# Patient Record
Sex: Female | Born: 1985 | Race: Black or African American | Hispanic: No | Marital: Single | Smoking: Current every day smoker
Health system: Southern US, Community
[De-identification: ages and names within clinical notes are randomized; demographics above are authoritative.]

## PROBLEM LIST (undated history)

## (undated) DIAGNOSIS — D649 Anemia, unspecified: Secondary | ICD-10-CM

## (undated) HISTORY — DX: Anemia, unspecified: D64.9

---

## 2014-08-17 ENCOUNTER — Encounter (HOSPITAL_COMMUNITY): Payer: Self-pay | Admitting: Emergency Medicine

## 2014-08-17 ENCOUNTER — Emergency Department (HOSPITAL_COMMUNITY)
Admission: EM | Admit: 2014-08-17 | Discharge: 2014-08-17 | Disposition: A | Discharge status: Home / Self Care | Payer: Self-pay | Attending: Emergency Medicine | Admitting: Emergency Medicine

## 2014-08-17 DIAGNOSIS — Z720 Tobacco use: Secondary | ICD-10-CM | POA: Insufficient documentation

## 2014-08-17 DIAGNOSIS — B354 Tinea corporis: Secondary | ICD-10-CM | POA: Insufficient documentation

## 2014-08-17 DIAGNOSIS — R21 Rash and other nonspecific skin eruption: Secondary | ICD-10-CM

## 2014-08-17 MED ORDER — HYDROXYZINE HCL 25 MG PO TABS
25.0000 mg | ORAL_TABLET | Freq: Four times a day (QID) | ORAL | Status: DC | PRN
Start: 2014-08-17 — End: 2016-03-18

## 2014-08-17 MED ORDER — CLOTRIMAZOLE-BETAMETHASONE 1-0.05 % EX CREA: TOPICAL_CREAM | CUTANEOUS | Status: DC

## 2014-08-17 NOTE — Discharge Instructions (Signed)
Use lotrisone cream for itching and rash, and use vistaril for itching, and continue your usual home medications. Avoid contact with other individuals since this is contagious. Wash all bedding and clothing in hot water, and clean all home surfaces with clorox wipes (such as the bathroom). Please followup with your primary doctor for discussion of your diagnoses and further evaluation after today's visit; if you do not have a primary care doctor use the resource guide provided to find one; Also follow-up as needed with dermatologist. Return to the ER with changes or worsening symptoms.   Body Ringworm Ringworm (tinea corporis) is a fungal infection of the skin on the body. This infection is not caused by worms, but is actually caused by a fungus. Fungus normally lives on the top of your skin and can be useful. However, in the case of ringworms, the fungus grows out of control and causes a skin infection. It can involve any area of skin on the body and can spread easily from one person to another (contagious). Ringworm is a common problem for children, but it can affect adults as well. Ringworm is also often found in athletes, especially wrestlers who share equipment and mats.  CAUSES  Ringworm of the body is caused by a fungus called dermatophyte. It can spread by:  Touchingother people who are infected.  Touchinginfected pets.  Touching or sharingobjects that have been in contact with the infected person or pet (hats, combs, towels, clothing, sports equipment). SYMPTOMS   Itchy, raised red spots and bumps on the skin.  Ring-shaped rash.  Redness near the border of the rash with a clear center.  Dry and scaly skin on or around the rash. Not every person develops a ring-shaped rash. Some develop only the red, scaly patches. DIAGNOSIS  Most often, ringworm can be diagnosed by performing a skin exam. Your caregiver may choose to take a skin scraping from the affected area. The sample will be  examined under the microscope to see if the fungus is present.  TREATMENT  Body ringworm may be treated with a topical antifungal cream or ointment. Sometimes, an antifungal shampoo that can be used on your body is prescribed. You may be prescribed antifungal medicines to take by mouth if your ringworm is severe, keeps coming back, or lasts a long time.  HOME CARE INSTRUCTIONS   Only take over-the-counter or prescription medicines as directed by your caregiver.  Wash the infected area and dry it completely before applying yourcream or ointment.  When using antifungal shampoo to treat the ringworm, leave the shampoo on the body for 3-5 minutes before rinsing.   Wear loose clothing to stop clothes from rubbing and irritating the rash.  Wash or change your bed sheets every night while you have the rash.  Have your pet treated by your veterinarian if it has the same infection. To prevent ringworm:   Practice good hygiene.  Wear sandals or shoes in public places and showers.  Do not share personal items with others.  Avoid touching red patches of skin on other people.  Avoid touching pets that have bald spots or wash your hands after doing so. SEEK MEDICAL CARE IF:   Your rash continues to spread after 7 days of treatment.  Your rash is not gone in 4 weeks.  The area around your rash becomes red, warm, tender, and swollen. Document Released: 05/16/2000 Document Revised: 02/11/2012 Document Reviewed: 12/01/2011 [facility] Patient Information 2015 [place]. This information is not intended to replace  advice given to you by your health care provider. Make sure you discuss any questions you have with your health care provider.  Contact Dermatitis Contact dermatitis is a rash that happens when something touches the skin. You touched something that irritates your skin, or you have allergies to something you touched. HOME CARE   Avoid the thing that caused your rash.  Keep your  rash away from hot water, soap, sunlight, chemicals, and other things that might bother it.  Do not scratch your rash.  You can take cool baths to help stop itching.  Only take medicine as told by your doctor.  Keep all doctor visits as told. GET HELP RIGHT AWAY IF:   Your rash is not better after 3 days.  Your rash gets worse.  Your rash is puffy (swollen), tender, red, sore, or warm.  You have problems with your medicine. MAKE SURE YOU:   Understand these instructions.  Will watch your condition.  Will get help right away if you are not doing well or get worse. Document Released: 03/16/2009 Document Revised: 08/11/2011 Document Reviewed: 10/22/2010 [facility] Patient Information 2015 [place], [place]. This information is not intended to replace advice given to you by your health care provider. Make sure you discuss any questions you have with your health care provider.   Emergency Department Resource Guide 1) Find a Doctor and Pay Out of Pocket Although you won't have to find out who is covered by your insurance plan, it is a good idea to ask around and get recommendations. You will then need to call the office and see if the doctor you have chosen will accept you as a new patient and what types of options they offer for patients who are self-pay. Some doctors offer discounts or will set up payment plans for their patients who do not have insurance, but you will need to ask so you aren't surprised when you get to your appointment.  2) Contact Your Local Health Department Not all health departments have doctors that can see patients for sick visits, but many do, so it is worth a call to see if yours does. If you don't know where your local health department is, you can check in your phone book. The CDC also has a tool to help you locate your state's health department, and many state websites also have listings of all of their local health departments.  3) Find a Walk-in Clinic If  your illness is not likely to be very severe or complicated, you may want to try a walk in clinic. These are popping up all over the country in pharmacies, drugstores, and shopping centers. They're usually staffed by nurse practitioners or physician assistants that have been trained to treat common illnesses and complaints. They're usually fairly quick and inexpensive. However, if you have serious medical issues or chronic medical problems, these are probably not your best option.  No Primary Care Doctor: - Call Health Connect at  [phone] - they can help you locate a primary care doctor that  accepts your insurance, provides certain services, etc. - Physician Referral Service- [phone]  Chronic Pain Problems: Organization         Address  Phone   Notes  [facility]  [phone] Patients need to be referred by their primary care doctor.   Medication Assistance: Organization         Address  Phone   Notes  [facility] Medication Assistance Program [address]  [address] [place], [place] [address] [phone] --Must be a resident of [facility] -- Must have NO insurance coverage whatsoever (no Medicaid/ Medicare, etc.) -- The pt. MUST have a primary care doctor that directs their care regularly and follows them in the community   [facility]  [phone]   [employer]  [phone]    Agencies that provide inexpensive medical care: Organization         Address  Phone   Notes  [facility]  [phone]   [facility]    [phone]   [facility] [address] [place], [place] [address] [phone]   [facility] [address] [place] [place] [phone]   [facility]    [phone]   [facility]    [phone]   [facility] and [facility]  [address], [place] Phone:  [phone], Fax:  [phone] Hours of  Operation:  9 am - 6 pm, M-F.  Also accepts Medicaid/Medicare and self-pay.  [facility]  [address], [place] Phone: [phone], Fax: [phone]. Hours of Operation:  8:30 am - 5:30 pm, M-F.  Also accepts Medicaid and self-pay.  [facility] [place] [address] [place] Phone: [phone]   [facility] [address] Natasha Bence [place], [place] [phone], Ext. [phone] Mondays & Thursdays: 7-9 AM.  First 15 patients are seen on a first come, first serve basis.    Medicaid-accepting [facility] Providers:  Organization         Address  Phone   Notes  [facility] [address], [place] [phone] Also accepts self-pay patients.  [facility] [address] Laurell Josephs [place], [place]  [phone]   [facility] [address], [place] [phone]   [facility] [address], [place] [phone]   Renaye Rakers [address], [place]   [phone] Only accepts [place] patients after they have their name applied to their card.   Self-Pay (no insurance) in [facility]:  Organization         Address  Phone   Notes  Sickle Cell Patients, [facility] [address] [place], [place] [phone]   [facility] [address] [employer], [place] [phone]   [facility] [place]  [address], [address], [place] [phone]   [facility]/Dr. Osei-Bonsu  [address] [place] or [address], [place] [phone] Phone number for both [place] and [place] locations is the same.  [facility] and [facility] [address] [place] [address] [place] [phone]   [facility] [address], [place] or [address] [phone]   [facility] [address], [place] [phone], phone; [phone], fax Sees patients 1st and 3rd Saturday of every month.  Must not qualify for public or private insurance (i.e. Medicaid, Medicare, [place] Health Choice, Veterans' Benefits)  Household income should be no more than 200% of the poverty level The clinic cannot treat you if you are pregnant or think you are pregnant  Sexually transmitted diseases are not treated at the clinic.

## 2014-08-17 NOTE — ED Provider Notes (Signed)
CSN: [id number]     Arrival date & time 08/17/14  1201 History  This chart was scribed for non-physician practitioner, Allen DerryMercedes Camprubi-Soms, PA-C, working with Tilden FossaElizabeth Rees, MD, by Ronney LionSuzanne Le, ED Scribe. This patient was seen in room WTR6/WTR6 and the patient's care was started at 1:55 PM.    Chief Complaint  Patient presents with  . Rash   Patient is a 29 y.o. female presenting with rash. The history is provided by the patient. No language interpreter was used.  Rash Location:  Shoulder/arm and torso Shoulder/arm rash location:  R forearm Torso rash location: center chest. Quality: itchiness   Quality: not painful and not weeping   Severity:  Mild Onset quality:  Sudden Duration:  1 week Timing:  Constant Progression:  Spreading Chronicity:  New Context: medications (Diflucan and Flagyl, but she has taken in the past with no issue)   Context: not animal contact, not chemical exposure, not food, not insect bite/sting and not new detergent/soap   Relieved by:  Nothing Worsened by:  Nothing tried Ineffective treatments:  None tried Associated symptoms: no fever, no shortness of breath, no throat swelling and no tongue swelling      HPI Comments: Lauren HumphreyKatrina Vandemark is a 29 y.o. female who presents to the Emergency Department complaining of two pruritic bumps on her right arm 1 week ago and a pruritic rash on her chest that she noticed 2 days ago. She states that she had been to the gym the day before she noticed the bumps on her arm. She denies trying any ointments to the area. She had been taking Flagyl and Diflucan for BV, which she has taken before without any issue. Patient had been to an outdoor birthday party 5 days ago, but didn't notice any insect or tick bites. She denies exposure to new detergents or cosmetics, foods, or pets. Her significant other had a similar rash 3wks ago. She denies pain to the area, drainage, weeping, tongue or lip swelling, trouble swallowing, SOB, CP,  fever, red streaking, or warmth. She denies having a PCP.  History reviewed. No pertinent past medical history. History reviewed. No pertinent past surgical history. History reviewed. No pertinent family history. History  Substance Use Topics  . Smoking status: Current Every Day Smoker    Types: Cigarettes  . Smokeless tobacco: Not on file  . Alcohol Use: Yes   OB History    No data available     Review of Systems  Constitutional: Negative for fever.  HENT: Negative for facial swelling and trouble swallowing.   Respiratory: Negative for shortness of breath.   Skin: Positive for rash.       Negative for drainage, red streaking, warmth  A complete 10 system review of systems was obtained and all systems are negative except as noted in the HPI and PMH.    Allergies  Review of patient's allergies indicates no known allergies.  Home Medications   Prior to Admission medications   Not on File   BP 122/72 mmHg  Pulse 68  Temp(Src) 98.2 F (36.8 C) (Oral)  Resp 18  SpO2 99%  LMP 08/01/2014 Physical Exam  Constitutional: She is oriented to person, place, and time. Vital signs are normal. She appears well-developed and well-nourished.  Non-toxic appearance. No distress.  Afebrile, nontoxic, NAD  HENT:  Head: Normocephalic and atraumatic.  Mouth/Throat: Mucous membranes are normal.  No tongue/lip swelling  Eyes: Conjunctivae and EOM are normal. Right eye exhibits no discharge. Left eye exhibits no  discharge.  Neck: Normal range of motion. Neck supple.  Cardiovascular: Normal rate.   Pulmonary/Chest: Effort normal. No respiratory distress.  Abdominal: Normal appearance. She exhibits no distension.  Musculoskeletal: Normal range of motion.  Neurological: She is alert and oriented to person, place, and time. She has normal strength. No sensory deficit.  Skin: Skin is warm, dry and intact. Rash noted.  2 separate Well circumscribed lesions to R medial elbow, mildly erythematous  with slightly raised borders, no scaling or cellulitis. 4 more well circumscribed similar appearing lesions to R breast/chest wall. No interdigital web space burrowing  Psychiatric: She has a normal mood and affect. Her behavior is normal.  Nursing note and vitals reviewed.   ED Course  Procedures (including critical care time)  DIAGNOSTIC STUDIES: Oxygen Saturation is 99% on room air, normal by my interpretation.    COORDINATION OF CARE: 1:55 PM - Discussed treatment plan with pt at bedside which includes cream to treat ringworm and itching, Vistaril and pt agreed to plan. Suspect allergic reaction or ringworm. Strict return precautions given if patient notices worsening symptoms.   Labs Review Labs Reviewed - No data to display  Imaging Review No results found.   EKG Interpretation None      MDM   Final diagnoses:  Tinea corporis  Rash    29 y.o. female here with rash to R elbow and chest, erythematous without abscess or cellulitis, no burrowing or intertriginous areas, no interdigital web space affected areas, doubt scabies. Appears c/w tinea corporis. Hx of possible exposure from significant other vs gym equipment. Will start on lotrisone and vistaril. Will have her see PCP from resource guide. I explained the diagnosis and have given explicit precautions to return to the ER including for any other new or worsening symptoms. The patient understands and accepts the medical plan as it's been dictated and I have answered their questions. Discharge instructions concerning home care and prescriptions have been given. The patient is STABLE and is discharged to home in good condition.   I personally performed the services described in this documentation, which was scribed in my presence. The recorded information has been reviewed and is accurate.  BP 122/72 mmHg  Pulse 68  Temp(Src) 98.2 F (36.8 C) (Oral)  Resp 18  SpO2 99%  LMP 08/01/2014  Meds ordered this encounter   Medications  . clotrimazole-betamethasone (LOTRISONE) cream    Sig: Apply to affected area 2 times daily x14 days    Dispense:  15 g    Refill:  0    Order Specific Question:  Supervising Provider    Answer:  MILLER, BRIAN [[id number]]  . hydrOXYzine (ATARAX/VISTARIL) 25 MG tablet    Sig: Take 1 tablet (25 mg total) by mouth every 6 (six) hours as needed for itching.    Dispense:  28 tablet    Refill:  0    Order Specific Question:  Supervising Provider    Answer:  Angus Seller Camprubi-Soms, PA-C 08/17/14 1425  Tilden Fossa, MD 08/17/14 [phone]

## 2014-08-17 NOTE — ED Notes (Signed)
Pt c/o red rash to right AC, along with some raised, penny-sized, red marks on chest similar in appearance to a insect bite.

## 2016-03-17 ENCOUNTER — Ambulatory Visit: Payer: Self-pay | Admitting: Family Medicine

## 2016-03-18 ENCOUNTER — Ambulatory Visit (INDEPENDENT_AMBULATORY_CARE_PROVIDER_SITE_OTHER): Payer: BC Managed Care – PPO | Admitting: Family Medicine

## 2016-03-18 ENCOUNTER — Encounter: Payer: Self-pay | Admitting: Family Medicine

## 2016-03-18 VITALS — BP 98/64 | HR 63 | Temp 98.6°F | Resp 12 | Ht 64.0 in | Wt 160.0 lb

## 2016-03-18 DIAGNOSIS — R5382 Chronic fatigue, unspecified: Secondary | ICD-10-CM | POA: Diagnosis not present

## 2016-03-18 DIAGNOSIS — R079 Chest pain, unspecified: Secondary | ICD-10-CM

## 2016-03-18 DIAGNOSIS — D509 Iron deficiency anemia, unspecified: Secondary | ICD-10-CM | POA: Diagnosis not present

## 2016-03-18 DIAGNOSIS — G4710 Hypersomnia, unspecified: Secondary | ICD-10-CM

## 2016-03-18 NOTE — Progress Notes (Signed)
Pre visit review using our clinic review tool, if applicable. No additional management support is needed unless otherwise documented below in the visit note. 

## 2016-03-18 NOTE — Progress Notes (Signed)
HPI:   [name] is a 30 y.o. female, who is here today to establish care with me.  Former PCP: N/A Last preventive routine visit: 08/2015 with her gynecologists.  Concerns today: fatigue and chest pain.  Fatigue: She has had it for about 2 years, stable. She is reporting waking up a few times during the night, she goes to bed between 10 and 11 PM occasionally after 12 midnight, and around 6:30 am she wakes up to go to the bathroom; back to sleep on and off until 7:30 AM.  She denies loud snoring or known sleep apnea. Sometimes she falls asleep at work, while she is waiting in the waiting room, sometimes she almost does so while driving. She denies any history of depression or anxiety. Denies any unusual stress. Reporting history of anemia, iron deficiency. She is not taking any iron supplementation. Denies heavy menses, although reporting vaginal spotting in August 2017, already followed with her gynecologist. She is currently on Depo-Provera.  She is also reporting history of vitamin D deficiency, she is not taking vitamin D supplementation either.  Chest pain: Intermittently , right and left chest, for a while.  It is usually in the morning when she wakes up, pressure like, mild, not radiated,lasts about 20 min and not daily. No exertional chest pain when exercising at the gym or with intense physical activity. She has not identified exacerbating or alleviating factors.  No associated dyspnea or wheezing. + Smoker, intermittently for 10 years, 1 PPD. No heartburn or nausea.  Also for a while IP bilateral arthralgias, some fingers and toes right foot  "locking up", intermittently. No limitation of ROM. + Stiffness, usually with job related activities , hair dresser. No joint edema or erythema. No numbness or tingling. She denies fever, chills, or loss, oral lesions, or skin rash.  She exercises regularly, 3 times per week she goes to the gym; tries to  follow a healthy diet.    Review of Systems  Constitutional: Positive for fatigue. Negative for activity change, appetite change, fever and unexpected weight change.  HENT: Negative for facial swelling, mouth sores, nosebleeds, trouble swallowing and voice change.   Eyes: Negative for redness and visual disturbance.  Respiratory: Negative for apnea, cough, chest tightness, shortness of breath and wheezing.   Cardiovascular: Positive for chest pain. Negative for palpitations and leg swelling.  Gastrointestinal: Negative for abdominal pain, blood in stool, nausea and vomiting.       Negative for changes in bowel habits.  Endocrine: Negative for cold intolerance, heat intolerance, polydipsia, polyphagia and polyuria.  Genitourinary: Negative for decreased urine volume, difficulty urinating and hematuria.  Musculoskeletal: Positive for arthralgias. Negative for back pain, joint swelling, myalgias and neck pain.  Skin: Negative for color change and rash.  Neurological: Negative for syncope, weakness, numbness and headaches.  Hematological: Negative for adenopathy. Does not bruise/bleed easily.  Psychiatric/Behavioral: Positive for sleep disturbance. Negative for confusion. The patient is not nervous/anxious.       No current outpatient prescriptions on file prior to visit.   No current facility-administered medications on file prior to visit.      Past Medical History:  Diagnosis Date  . Anemia    No Known Allergies  Family History  Problem Relation Age of Onset  . Hyperlipidemia Mother   . Mental retardation Mother   . Mental illness Father   . Alcohol abuse Father     Social History   Social History  . Marital  status: Single    Spouse name: N/A  . Number of children: N/A  . Years of education: N/A   Social History Main Topics  . Smoking status: Current Every Day Smoker    Types: Cigarettes  . Smokeless tobacco: Never Used  . Alcohol use Yes     Comment: weekends    . Drug use: No  . Sexual activity: Yes    Birth control/ protection: Injection   Other Topics Concern  . None   Social History Narrative  . None    Vitals:   03/18/16 1500  BP: 98/64  Pulse: 63  Resp: 12  Temp: 98.6 F (37 C)   O2 sat 98% at RA.  Body mass index is 27.46 kg/m.    Physical Exam  Nursing note and vitals reviewed. Constitutional: She is oriented to person, place, and time. She appears well-developed. No distress.  HENT:  Head: Atraumatic.  Mouth/Throat: Oropharynx is clear and moist and mucous membranes are normal.  Eyes: Conjunctivae and EOM are normal. Pupils are equal, round, and reactive to light.  Neck: No tracheal deviation present. No thyroid mass and no thyromegaly present.  Cardiovascular: Normal rate and regular rhythm.   No murmur heard. Pulses:      Dorsalis pedis pulses are 2+ on the right side, and 2+ on the left side.  Respiratory: Effort normal and breath sounds normal. No respiratory distress. She exhibits no tenderness.  GI: Soft. She exhibits no mass. There is no hepatomegaly. There is no tenderness.  Musculoskeletal: She exhibits no edema or tenderness.  No signs of synovitis or significant deformities. Normal ROM of the wrist, IP,knees, and ankles bilateral  Lymphadenopathy:    She has no cervical adenopathy.       Right: No supraclavicular adenopathy present.       Left: No supraclavicular adenopathy present.  Neurological: She is alert and oriented to person, place, and time. She has normal strength. Coordination and gait normal.  Skin: Skin is warm. No rash noted. No erythema.  Psychiatric: Her speech is normal. Her mood appears not anxious. Cognition and memory are normal. She does not exhibit a depressed mood.  Flat affect. Well groomed, good eye contact.      ASSESSMENT AND PLAN:     Lauren Stark was seen today for establish care.  Diagnoses and all orders for this visit:  Chronic fatigue  We discussed possible  etiologies, he seems to be stable. She has not had workup done, today some labs ordered.  Further recommendations would be given accordingly.  -     CBC with Differential -     Ferritin -     VITAMIN D 25 Hydroxy (Vit-D Deficiency, Fractures) -     Basic metabolic panel -     TSH -     C-reactive protein  Chest pain, unspecified type  We discussed possible causes, it seems chronic.  It does not seem to be cardiac. I don't think imaging is needed at this time. Smoking cessation encouraged. Instructed about warning signs.   Hypersomnolence  OSA to be considered, he had lab work otherwise negative referral to pulmonologist/sleep specialist will be considered.  -     TSH -     C-reactive protein  Iron deficiency anemia, unspecified iron deficiency anemia type  Further recommendations will be given according to lab results.   -     CBC with Differential -     Ferritin     -Follow up will be  arranged according to lab results and recommended treatments.     Lauren G. [place], MD  [facility]. [place] office.

## 2016-03-18 NOTE — Patient Instructions (Addendum)
A few things to remember from today's visit:   Chronic fatigue - Plan: CBC with Differential, Ferritin, VITAMIN D 25 Hydroxy (Vit-D Deficiency, Fractures), Basic metabolic panel, TSH  Further recommendations depending of lab results.  It is a common symptom associated with multiple factors: psychologic,medications, systemic illness, sleep disorders,infections, and unknown causes. Some work-up can be done to evaluate for common causes as thyroid disease,anemia,diabetes, or abnormalities in calcium,potassium,or sodium. Regular physical activity as tolerated and a healthy diet is usually might help and usually recommended for chronic fatigue.   We have ordered labs or studies at this visit.  It can take up to 1-2 weeks for results and processing. IF results require follow up or explanation, we will call you with instructions. Clinically stable results will be released to your [facility]. If you have not heard from us or cannot find your results in [facility] in 2 weeks please contact our office at [phone].  If you are not yet signed up for [facility], please consider signing up    If a new problem present, please set up appointment sooner than planned today.

## 2016-12-18 ENCOUNTER — Encounter (HOSPITAL_COMMUNITY): Payer: Self-pay | Admitting: Emergency Medicine

## 2016-12-18 ENCOUNTER — Ambulatory Visit (HOSPITAL_COMMUNITY)
Admission: EM | Admit: 2016-12-18 | Discharge: 2016-12-18 | Disposition: A | Discharge status: Home / Self Care | Payer: BC Managed Care – PPO | Attending: Family Medicine | Admitting: Family Medicine

## 2016-12-18 DIAGNOSIS — R5383 Other fatigue: Secondary | ICD-10-CM | POA: Diagnosis not present

## 2016-12-18 DIAGNOSIS — R079 Chest pain, unspecified: Secondary | ICD-10-CM

## 2016-12-18 LAB — POCT I-STAT, CHEM 8
BUN: 17 mg/dL (ref 6–20)
CALCIUM ION: 1.23 mmol/L (ref 1.15–1.40)
CREATININE: 0.9 mg/dL (ref 0.44–1.00)
Chloride: 105 mmol/L (ref 101–111)
GLUCOSE: 67 mg/dL (ref 65–99)
HEMATOCRIT: 37 % (ref 36.0–46.0)
Hemoglobin: 12.6 g/dL (ref 12.0–15.0)
Potassium: 3.9 mmol/L (ref 3.5–5.1)
Sodium: 141 mmol/L (ref 135–145)
TCO2: 26 mmol/L (ref 0–100)

## 2016-12-18 LAB — POCT URINALYSIS DIP (DEVICE)
Bilirubin Urine: NEGATIVE
Glucose, UA: NEGATIVE mg/dL
HGB URINE DIPSTICK: NEGATIVE
Ketones, ur: NEGATIVE mg/dL
Leukocytes, UA: NEGATIVE
NITRITE: NEGATIVE
PH: 6 (ref 5.0–8.0)
PROTEIN: NEGATIVE mg/dL
Specific Gravity, Urine: 1.025 (ref 1.005–1.030)
Urobilinogen, UA: 0.2 mg/dL (ref 0.0–1.0)

## 2016-12-18 LAB — POCT PREGNANCY, URINE: PREG TEST UR: NEGATIVE

## 2016-12-18 NOTE — Discharge Instructions (Signed)
Today you were diagnosed with the following: 1. Fatigue, unspecified type   2. Chest pain, unspecified type    Testing done today was normal.  If you are not improving over the next few days or feel you are worsening please follow up here or the Emergency Department if you are unable to see your regular doctor.

## 2016-12-18 NOTE — ED Notes (Signed)
Patient is unable to void at this time 

## 2016-12-18 NOTE — ED Notes (Signed)
PT discharged by Dr. Tracie HarrierHagler

## 2016-12-18 NOTE — ED Triage Notes (Signed)
PT reports she noticed two bug bites on her back this AM. PT reports chest tightness today, pain when raising arms, right sided weakness and numbness, and left eye blurry. All symptoms started today.

## 2016-12-18 NOTE — ED Provider Notes (Addendum)
[facility]   [id number] 12/18/16 Arrival Time: 1524  ASSESSMENT & PLAN:  1. Fatigue, unspecified type   2. Chest pain, unspecified type    No obvious cause of symptoms identified. I reassured her that I feel this is nothing dangerous. Close observation and to f/u if needed.  Reviewed expectations re: course of current medical issues. Questions answered. Outlined signs and symptoms indicating need for more acute intervention. Patient verbalized understanding. After Visit Summary given.   SUBJECTIVE:  Lauren Stark is a 31 y.o. female who presents with complaint of feeling "not right" starting this morning. Overall fatigued. Has been sleeping normally. Afebrile. No recent illnesses. No SOB or respiratory difficulties reported. Appetite and PO intake normal. No n/v/d. No LMP recorded. Patient has had an injection. BMs normal. No specific urinary symptoms. No prolonged heat exposure. No headaches. Ambulatory without difficulty. Some chest tightness reported. Also reports transient "tingling" in R upper and lower extremities. No extremity strength changes. No h/o similar symptoms. Denies recent stress/anxiety. No self treatment. No recent travel or sick contacts.  ROS: As per HPI. All other systems negative.   OBJECTIVE:  Vitals:   12/18/16 1550 12/18/16 1551  BP:  113/65  Pulse:  64  Resp:  16  Temp:  99.2 F (37.3 C)  TempSrc:  Oral  SpO2:  100%  Weight: 167 lb (75.8 kg)   Height: 5\' 4"  (1.626 m)      General appearance: alert; no distress Lungs: clear to auscultation bilaterally Heart: regular rate and rhythm Extremities: no cyanosis or edema; symmetrical with no gross deformities Skin: warm and dry; two possible mosquito bites on back <1cm slightly raised with mild erythema; no signs of skin infection Neuro: normal exam Psych: alert and oriented; normal affect   Results for orders placed or performed during the hospital encounter of 12/18/16    I-STAT, chem 8  Result Value Ref Range   Sodium 141 135 - 145 mmol/L   Potassium 3.9 3.5 - 5.1 mmol/L   Chloride 105 101 - 111 mmol/L   BUN 17 6 - 20 mg/dL   Creatinine, Ser 4.09 0.44 - 1.00 mg/dL   Glucose, Bld 67 65 - 99 mg/dL   Calcium, Ion 8.11 9.14 - 1.40 mmol/L   TCO2 26 0 - 100 mmol/L   Hemoglobin 12.6 12.0 - 15.0 g/dL   HCT 78.2 95.6 - 21.3 %  POCT urinalysis dip (device)  Result Value Ref Range   Glucose, UA NEGATIVE NEGATIVE mg/dL   Bilirubin Urine NEGATIVE NEGATIVE   Ketones, ur NEGATIVE NEGATIVE mg/dL   Specific Gravity, Urine 1.025 1.005 - 1.030   Hgb urine dipstick NEGATIVE NEGATIVE   pH 6.0 5.0 - 8.0   Protein, ur NEGATIVE NEGATIVE mg/dL   Urobilinogen, UA 0.2 0.0 - 1.0 mg/dL   Nitrite NEGATIVE NEGATIVE   Leukocytes, UA NEGATIVE NEGATIVE  Pregnancy, urine POC  Result Value Ref Range   Preg Test, Ur NEGATIVE NEGATIVE     ED ECG REPORT   Date: 12/18/2016  Rate: 55    Rhythm: sinus bradycardia and sinus arrhythmia  QRS Axis: normal  Intervals: normal  ST/T Wave abnormalities: normal  Conduction Disutrbances:none  Narrative Interpretation:   Old EKG Reviewed: none available  I have personally reviewed the EKG tracing and agree with the computerized printout as noted.  No Known Allergies  PMHx, SurgHx, SocialHx, Medications, and Allergies were reviewed in the Visit Navigator and updated as appropriate.      Mardella Layman, MD  12/22/16 [id number]    Mardella LaymanHagler, Quin Mathenia, MD 12/22/16 [id number]

## 2018-06-16 ENCOUNTER — Other Ambulatory Visit: Payer: Self-pay

## 2018-06-16 ENCOUNTER — Ambulatory Visit (INDEPENDENT_AMBULATORY_CARE_PROVIDER_SITE_OTHER): Payer: BC Managed Care – PPO

## 2018-06-16 ENCOUNTER — Ambulatory Visit (HOSPITAL_COMMUNITY)
Admission: EM | Admit: 2018-06-16 | Discharge: 2018-06-16 | Disposition: A | Discharge status: Home / Self Care | Payer: BC Managed Care – PPO | Attending: Family Medicine | Admitting: Family Medicine

## 2018-06-16 ENCOUNTER — Encounter (HOSPITAL_COMMUNITY): Payer: Self-pay | Admitting: Emergency Medicine

## 2018-06-16 DIAGNOSIS — M79671 Pain in right foot: Secondary | ICD-10-CM | POA: Diagnosis not present

## 2018-06-16 MED ORDER — NAPROXEN 500 MG PO TABS: 500.0000 mg | ORAL_TABLET | Freq: Two times a day (BID) | ORAL | 0 refills | Status: AC

## 2018-06-16 NOTE — Discharge Instructions (Signed)
X-rays did not show fracture or dislocation Brace placed Continue conservative management of rest, ice, and gentle stretches Take naproxen as needed for pain relief (may cause abdominal discomfort, ulcers, and GI bleeds avoid taking with other NSAIDs) Follow up with PCP if symptoms persist Return or go to the ER if you have any new or worsening symptoms (fever, chills, chest pain, abdominal pain, changes in bowel or bladder habits, redness, swelling, bruising, etc...)

## 2018-06-16 NOTE — ED Triage Notes (Signed)
Kicked a tire with right foot.  Incident was yesterday and pain was immediate.    Positive right dp-2+, particular pain with flexion of right foot

## 2018-06-16 NOTE — ED Provider Notes (Signed)
[facility]   [id number] 06/16/18 Arrival Time: 1107  CC: Right foot pain  SUBJECTIVE: History from: patient. Lauren Stark is a 33 y.o. female complains of right foot pain x 1 day.  Symptoms began after kicking a tire.  Localizes the pain to the bottom of foot.  Describes the pain as intermittent and "cramping" in character.  Has tried OTC medications without relief.  Symptoms are made worse with walking.  Denies similar symptoms in the past.  Denies fever, chills, erythema, ecchymosis, effusion, weakness, numbness and tingling.      ROS: As per HPI.  Past Medical History:  Diagnosis Date  . Anemia    Past Surgical History:  Procedure Laterality Date  . CESAREAN SECTION     2007-2015   No Known Allergies No current facility-administered medications on file prior to encounter.    No current outpatient medications on file prior to encounter.   Social History   Socioeconomic History  . Marital status: Single    Spouse name: Not on file  . Number of children: Not on file  . Years of education: Not on file  . Highest education level: Not on file  Occupational History  . Not on file  Social Needs  . Financial resource strain: Not on file  . Food insecurity:    Worry: Not on file    Inability: Not on file  . Transportation needs:    Medical: Not on file    Non-medical: Not on file  Tobacco Use  . Smoking status: Current Every Day Smoker    Packs/day: 0.25    Types: Cigarettes  . Smokeless tobacco: Never Used  Substance and Sexual Activity  . Alcohol use: Yes    Comment: weekends  . Drug use: Yes    Types: Marijuana  . Sexual activity: Yes    Birth control/protection: None  Lifestyle  . Physical activity:    Days per week: Not on file    Minutes per session: Not on file  . Stress: Not on file  Relationships  . Social connections:    Talks on phone: Not on file    Gets together: Not on file    Attends religious service: Not on file   Active member of club or organization: Not on file    Attends meetings of clubs or organizations: Not on file    Relationship status: Not on file  . Intimate partner violence:    Fear of current or ex partner: Not on file    Emotionally abused: Not on file    Physically abused: Not on file    Forced sexual activity: Not on file  Other Topics Concern  . Not on file  Social History Narrative  . Not on file   Family History  Problem Relation Age of Onset  . Hyperlipidemia Mother   . Mental retardation Mother   . Mental illness Father   . Alcohol abuse Father     OBJECTIVE:  Vitals:   06/16/18 1134  BP: 122/70  Pulse: (!) 59  Resp: 18  Temp: 98.4 F (36.9 C)  TempSrc: Oral  SpO2: 100%    General appearance: Alert; in no acute distress.  Head: NCAT Lungs: normal respiratory effort Heart: Dorsalis pedis pulse 2+; cap refill <2 sec Musculoskeletal: Right foot Inspection: Skin warm, dry, clear and intact without obvious erythema, effusion, or ecchymosis. Tattoo present on proximal dorsal aspect of foot Palpation: Diffusely TTP about the dorsal aspect of the 4th and  5th metatarsals, and plantar aspect of 1st-5th metatarsals ROM: FROM active and passive Strength: 5/5 dorsiflexion, 5/5 plantar flexion; with mild discomfort Skin: warm and dry Neurologic: Ambulates without difficulty; Sensation intact about the lower extremities Psychological: alert and cooperative; normal mood and affect  DIAGNOSTIC STUDIES:  Dg Foot Complete Right  Result Date: 06/16/2018 CLINICAL DATA:  Foot pain following kicking tire, initial encounter EXAM: RIGHT FOOT COMPLETE - 3+ VIEW COMPARISON:  None. FINDINGS: There is no evidence of fracture or dislocation. There is no evidence of arthropathy or other focal bone abnormality. Soft tissues are unremarkable. IMPRESSION: No acute abnormality noted. Electronically Signed   By: Alcide CleverMark  Lukens M.D.   On: 06/16/2018 12:09     ASSESSMENT & PLAN:  1. Right  foot pain     Meds ordered this encounter  Medications  . naproxen (NAPROSYN) 500 MG tablet    Sig: Take 1 tablet (500 mg total) by mouth 2 (two) times daily.    Dispense:  30 tablet    Refill:  0    Order Specific Question:   Supervising Provider    Answer:   Eustace MooreELSON, YVONNE SUE [[id number]]   X-rays did not show fracture or dislocation Brace placed Continue conservative management of rest, ice, and gentle stretches Take naproxen as needed for pain relief (may cause abdominal discomfort, ulcers, and GI bleeds avoid taking with other NSAIDs) Follow up with PCP if symptoms persist Return or go to the ER if you have any new or worsening symptoms (fever, chills, chest pain, abdominal pain, changes in bowel or bladder habits, redness, swelling, bruising, etc...)    Reviewed expectations re: course of current medical issues. Questions answered. Outlined signs and symptoms indicating need for more acute intervention. Patient verbalized understanding. After Visit Summary given.    Rennis HardingWurst, Rubby Barbary, PA-C 06/16/18 1233

## 2020-01-24 IMAGING — DX DG FOOT COMPLETE 3+V*R*
3 series · 3 of 3 positions shown · non-contrast
Comparison: None.

CLINICAL DATA: Foot pain following kicking tire, initial encounter

EXAM:
RIGHT FOOT COMPLETE - 3+ VIEW

[foot ap]
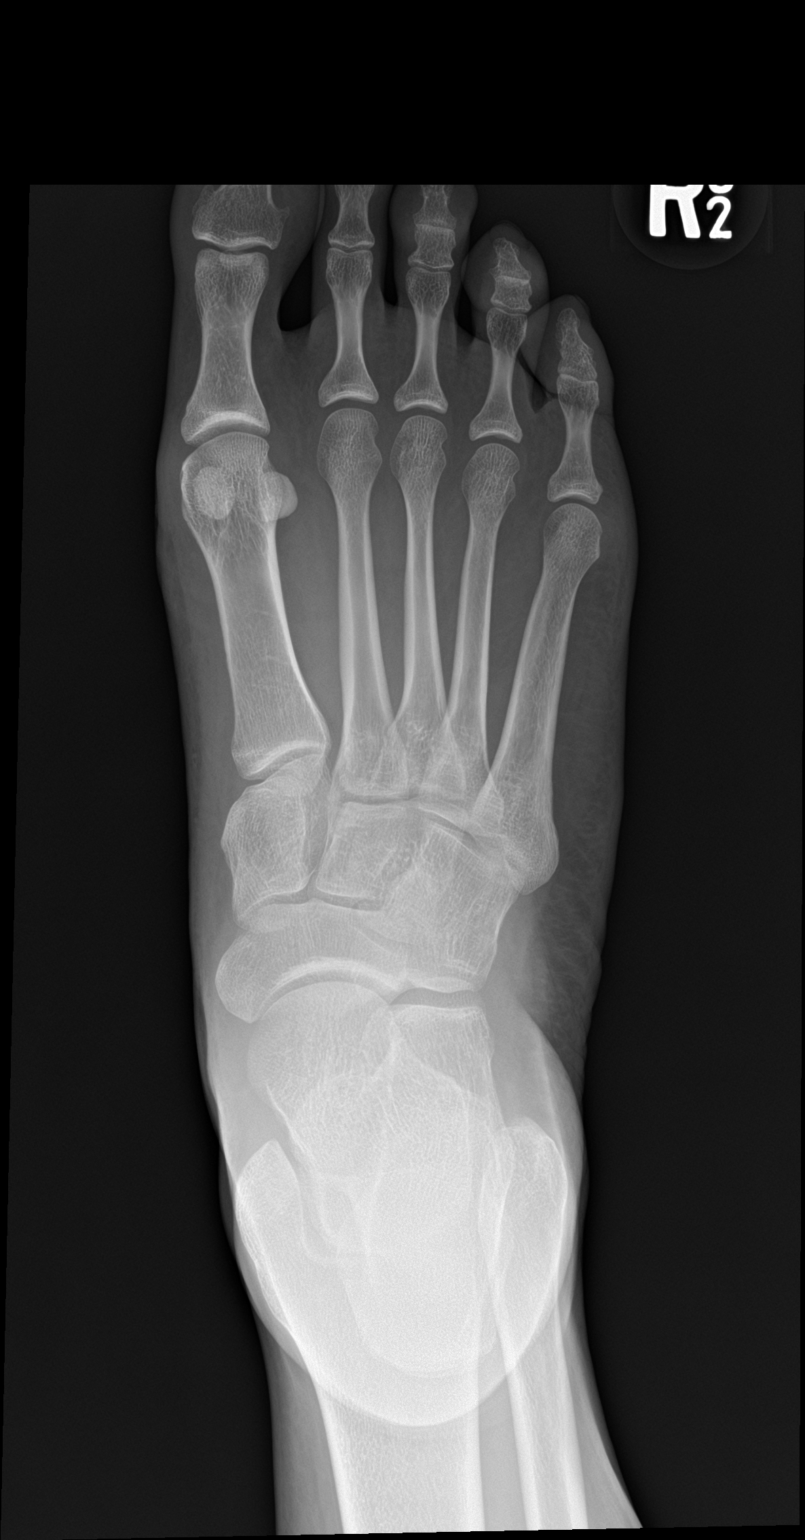

[foot obl]
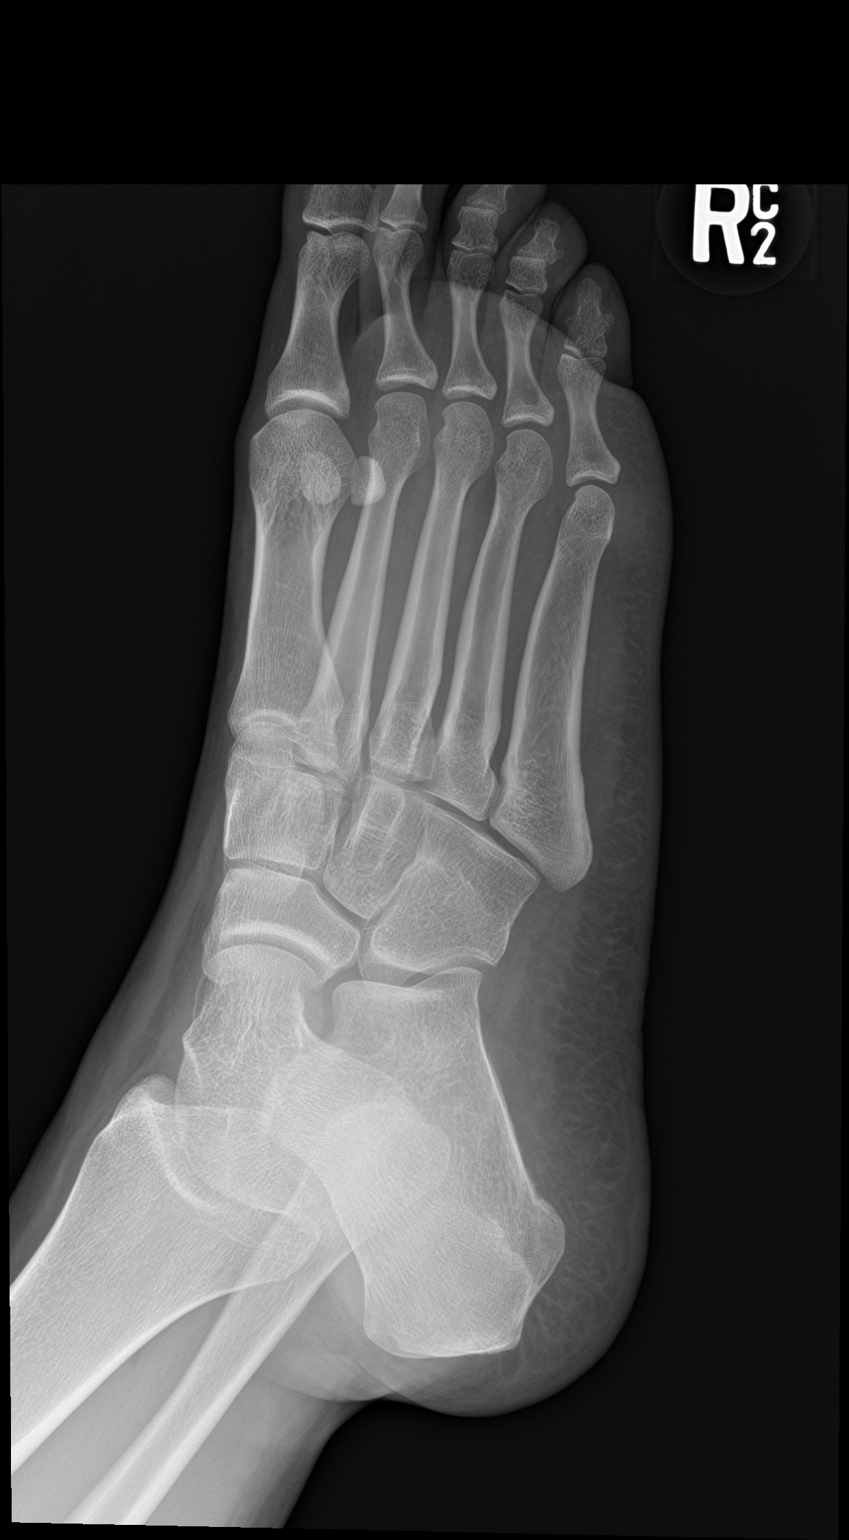

[foot lat]
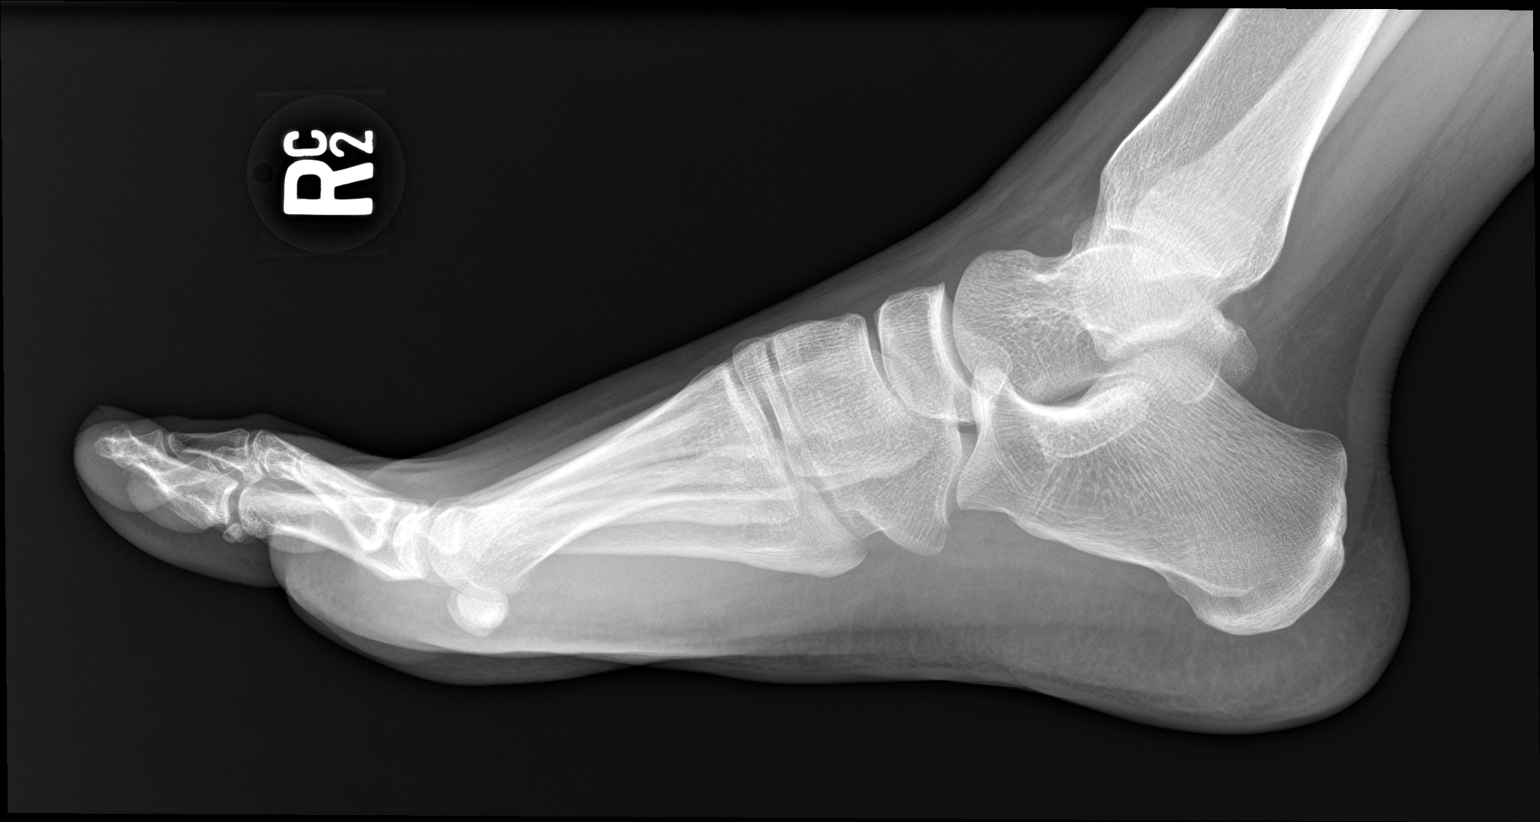

[3 of 3 positions shown; findings below may reference images not displayed]

FINDINGS: There is no evidence of fracture or dislocation. There is no
evidence of arthropathy or other focal bone abnormality. Soft
tissues are unremarkable.
IMPRESSION: No acute abnormality noted.

## 2020-02-16 ENCOUNTER — Ambulatory Visit: Payer: BC Managed Care – PPO | Attending: Family

## 2020-02-16 DIAGNOSIS — Z23 Encounter for immunization: Secondary | ICD-10-CM

## 2020-03-11 NOTE — Progress Notes (Signed)
   Covid-19 Vaccination Clinic  Name:  Lauren Stark    MRN: [id number] DOB: 1985-09-29  03/11/2020  [name] was observed post Covid-19 immunization for 15 minutes without incident. She was provided with Vaccine Information Sheet and instruction to access the V-Safe system.   [name] was instructed to call 911 with any severe reactions post vaccine: Marland Kitchen Difficulty breathing  . Swelling of face and throat  . A fast heartbeat  . A bad rash all over body  . Dizziness and weakness   Immunizations Administered    Name Date Dose VIS Date Route   Pfizer COVID-19 Vaccine 02/16/2020  9:30 AM 0.3 mL 07/27/2018 Intramuscular   Manufacturer: [employer], [employer]   Lot: [id number]   NDC: [address]-8
